# Patient Record
Sex: Male | Born: 1950 | Race: White | Hispanic: No | Marital: Married
Health system: Southern US, Community
[De-identification: ages and names within clinical notes are randomized; demographics above are authoritative.]

---

## 2004-01-15 ENCOUNTER — Observation Stay: Payer: Self-pay | Admitting: Internal Medicine

## 2004-02-15 ENCOUNTER — Ambulatory Visit: Payer: Self-pay | Admitting: Family Medicine

## 2004-11-01 ENCOUNTER — Ambulatory Visit: Payer: Self-pay | Admitting: Family Medicine

## 2005-04-14 ENCOUNTER — Ambulatory Visit: Payer: Self-pay

## 2009-11-15 ENCOUNTER — Ambulatory Visit: Payer: Self-pay

## 2011-06-04 ENCOUNTER — Ambulatory Visit: Payer: Self-pay | Admitting: Internal Medicine

## 2012-02-25 ENCOUNTER — Ambulatory Visit: Payer: Self-pay | Admitting: Internal Medicine

## 2012-06-28 ENCOUNTER — Ambulatory Visit: Payer: Self-pay | Admitting: General Practice

## 2012-06-28 LAB — BASIC METABOLIC PANEL
Calcium, Total: 9.6 mg/dL (ref 8.5–10.1)
Creatinine: 0.97 mg/dL (ref 0.60–1.30)
EGFR (African American): >60
Glucose: 88 mg/dL (ref 65–99)
Osmolality: 275 (ref 275–301)
Potassium: 3.5 mmol/L (ref 3.5–5.1)
Sodium: 139 mmol/L (ref 136–145)

## 2012-06-28 LAB — URINALYSIS, COMPLETE
Bilirubin,UR: NEGATIVE
Leukocyte Esterase: NEGATIVE
Protein: NEGATIVE
Specific Gravity: 1.016 (ref 1.003–1.030)

## 2012-06-28 LAB — CBC
MCH: 29.9 pg (ref 26.0–34.0)
MCHC: 34.3 g/dL (ref 32.0–36.0)
RBC: 5.78 10*6/uL (ref 4.40–5.90)
WBC: 5 10*3/uL (ref 3.8–10.6)

## 2012-06-28 LAB — MRSA PCR SCREENING

## 2012-06-28 LAB — PROTIME-INR
INR: 1
Prothrombin Time: 13.3 secs (ref 11.5–14.7)

## 2012-06-29 LAB — URINE CULTURE

## 2012-07-12 ENCOUNTER — Inpatient Hospital Stay: Payer: Self-pay | Admitting: General Practice

## 2012-07-13 LAB — BASIC METABOLIC PANEL
Calcium, Total: 8.2 mg/dL — ABNORMAL LOW (ref 8.5–10.1)
Chloride: 104 mmol/L (ref 98–107)
Co2: 30 mmol/L (ref 21–32)
Creatinine: 1.34 mg/dL — ABNORMAL HIGH (ref 0.60–1.30)
Potassium: 3.7 mmol/L (ref 3.5–5.1)
Sodium: 139 mmol/L (ref 136–145)

## 2012-07-13 LAB — PLATELET COUNT: Platelet: 68 10*3/uL — ABNORMAL LOW (ref 150–440)

## 2012-07-13 LAB — HEMOGLOBIN: HGB: 13.5 g/dL (ref 13.0–18.0)

## 2012-07-14 LAB — HEMOGLOBIN: HGB: 13.9 g/dL (ref 13.0–18.0)

## 2012-07-14 LAB — BASIC METABOLIC PANEL
BUN: 9 mg/dL (ref 7–18)
Calcium, Total: 8.3 mg/dL — ABNORMAL LOW (ref 8.5–10.1)
Co2: 27 mmol/L (ref 21–32)
EGFR (African American): >60
Potassium: 3.6 mmol/L (ref 3.5–5.1)
Sodium: 138 mmol/L (ref 136–145)

## 2012-07-20 ENCOUNTER — Emergency Department: Payer: Self-pay | Admitting: Emergency Medicine

## 2012-07-20 LAB — COMPREHENSIVE METABOLIC PANEL
Albumin: 3.9 g/dL (ref 3.4–5.0)
Alkaline Phosphatase: 187 U/L — ABNORMAL HIGH (ref 50–136)
Anion Gap: 7 (ref 7–16)
Calcium, Total: 9.8 mg/dL (ref 8.5–10.1)
Chloride: 99 mmol/L (ref 98–107)
Co2: 27 mmol/L (ref 21–32)
Creatinine: 1.44 mg/dL — ABNORMAL HIGH (ref 0.60–1.30)
EGFR (African American): 59 — ABNORMAL LOW
Glucose: 104 mg/dL — ABNORMAL HIGH (ref 65–99)
Osmolality: 268 (ref 275–301)
SGOT(AST): 75 U/L — ABNORMAL HIGH (ref 15–37)

## 2012-07-20 LAB — DRUG SCREEN, URINE
Barbiturates, Ur Screen: NEGATIVE (ref ?–200)
MDMA (Ecstasy)Ur Screen: NEGATIVE (ref ?–500)
Methadone, Ur Screen: NEGATIVE (ref ?–300)
Tricyclic, Ur Screen: NEGATIVE (ref ?–1000)

## 2012-07-20 LAB — URINALYSIS, COMPLETE
Glucose,UR: NEGATIVE mg/dL (ref 0–75)
Nitrite: NEGATIVE
Ph: 6 (ref 4.5–8.0)
Protein: 30
Specific Gravity: 1.023 (ref 1.003–1.030)
Squamous Epithelial: <1
WBC UR: 2 /HPF (ref 0–5)

## 2012-07-20 LAB — CBC
HCT: 44 % (ref 40.0–52.0)
HGB: 15 g/dL (ref 13.0–18.0)
MCHC: 33.9 g/dL (ref 32.0–36.0)
MCV: 88 fL (ref 80–100)
RDW: 14.4 % (ref 11.5–14.5)

## 2012-07-20 LAB — TROPONIN I: Troponin-I: <0.02 ng/mL

## 2012-07-20 LAB — SALICYLATE LEVEL: Salicylates, Serum: <1.7 mg/dL

## 2012-07-20 LAB — ACETAMINOPHEN LEVEL: Acetaminophen: <2 ug/mL

## 2013-06-24 ENCOUNTER — Ambulatory Visit: Payer: Self-pay | Admitting: Internal Medicine

## 2013-07-13 ENCOUNTER — Ambulatory Visit: Payer: Self-pay | Admitting: Gastroenterology

## 2013-07-14 ENCOUNTER — Ambulatory Visit: Payer: Self-pay | Admitting: Gastroenterology

## 2013-08-05 ENCOUNTER — Ambulatory Visit: Payer: Self-pay | Admitting: Physical Medicine and Rehabilitation

## 2013-11-06 DEATH — deceased

## 2014-04-28 NOTE — Op Note (Signed)
PATIENT NAME:  Duane Daniels, Duane Daniels MR#:  [id number] DATE OF BIRTH:  January 10, 1950  DATE OF PROCEDURE:  07/12/2012  PREOPERATIVE DIAGNOSIS: Degenerative arthrosis of the left knee.   POSTOPERATIVE DIAGNOSIS: Degenerative arthrosis of the left knee.   PROCEDURE PERFORMED: Left total knee arthroplasty using computer-assisted navigation.   SURGEON: Illene Labrador. Hooten, M.D.   ASSISTANT: Van Clines, PA (required to maintain retraction throughout the procedure)   ANESTHESIA: General.   ESTIMATED BLOOD LOSS: 100 mL.   FLUIDS REPLACED: 2100 mL of crystalloid.   TOURNIQUET TIME: 99 minutes.   DRAINS: Two medium drains to reinfusion system.   SOFT TISSUE RELEASES: Anterior cruciate ligament, posterior cruciate ligament, deep and superficial medial collateral ligament and patellofemoral ligament.   IMPLANTS UTILIZED: DePuy PFC Sigma size 4 posterior stabilized femoral component (cemented), size 4 MBT tibial component (cemented), 38 mm 3-peg oval dome patella (cemented) and a 12.5 mm stabilized rotating platform polyethylene insert.   INDICATIONS FOR SURGERY: The patient is a 64 year old gentleman, who has been seen for complaints of progressive left knee pain. X-rays demonstrated severe degenerative changes in tricompartmental fashion. After discussion of the risks and benefits of surgical intervention, the patient expressed understanding of the risks and benefits and agreed with plans for surgical intervention.   PROCEDURE IN DETAIL: The patient was brought to the operating room and, after adequate general anesthesia was achieved, a tourniquet was placed on the patient's upper left thigh. The patient's left knee and leg were cleaned and prepped with alcohol and DuraPrep and draped in the usual sterile fashion. A "timeout" was performed as per usual protocol. The left lower extremity was exsanguinated using an Esmarch, and tourniquet was inflated to 300 mmHg. An anterior longitudinal incision was made followed  by a standard mid vastus approach. A large effusion was evacuated. The deep fibers of the medial collateral ligament were elevated in a subperiosteal fashion off the medial flare of the tibia so as to maintain a continuous soft tissue sleeve. The patella was subluxed laterally and the patellofemoral ligament was incised. Inspection of the knee demonstrated severe degenerative changes in a tricompartmental fashion. Prominent osteophytes were debrided using a rongeur. The anterior and posterior cruciate ligaments were excised. Two 4.0 mm Schanz pins were inserted into the femur and into the tibia for attachment of the ray of trackers used for computer-assisted navigation. Hip center was identified using a circumduction technique. Distal landmarks were mapped using the computer. The distal femur and proximal tibia were mapped using the computer. Distal femoral cutting guide was positioned using computer-assisted navigation so as to achieve a 5 degree distal valgus cut. Cut was performed and verified using the computer. The distal femur was sized and it was felt that a size 4 femoral component was appropriate. A size 4 cutting guide was positioned and the anterior cut was performed and verified using the computer. This was followed by completion of the posterior and chamfer cuts. Femoral cutting guide for the central box was then positioned and the central box cut was performed.   Attention was then directed to the proximal tibia. Medial and lateral menisci were excised. The extramedullary tibial cutting guide was positioned using computer-assisted navigation so as to achieve a 0 degree varus valgus alignment and 0 degree posterior slope. Cut was performed and verified using the computer. The proximal tibia was sized and it was felt that a size 4 tibial tray was appropriate. Tibial and femoral trials were inserted followed by insertion of a 10 mm polyethylene insert.  The knee was felt to be tight medially. A Cobb  elevator was used to elevate the superficial fibers of the medial collateral ligament. The 10 mm polyethylene trial was replaced by a 12.5 mm polyethylene trial. This allowed for excellent mediolateral soft tissue balancing both in flexion and in extension. Finally, the patella was cut and prepared so as to accommodate a 38 mm 3-peg oval dome patella. Patellar trial was placed and the knee was placed through a range of motion. Excellent patellar tracking was appreciated. The femoral trial was removed. Central post hole for the tibial component was reamed followed by insertion of a keel punch. Tibial trial was then removed. The cut surfaces of bone were irrigated with copious amounts of normal saline with antibiotic solution and then suctioned dry. Polymethyl methacrylate cement with gentamicin was prepared in the usual fashion using a vacuum mixer. Cement was applied to the cut surface of the proximal tibia as well as along the undersurface of a size 4 MBT tibial component. The tibial component was positioned and impacted into place. Excess cement was removed using freer elevators. Cement was then applied to the cut surface of the femur as well as along the posterior flanges of a size 4 posterior stabilized femoral component. Femoral component was positioned and impacted into place. Excess cement was removed using freer elevators. A 12.5 mm polyethylene trial was inserted and the knee was brought in full extension with steady axial compression applied. Finally, cement was applied to the backside of a 38 mm 3-peg oval dome patella and the patellar component was positioned and patellar clamp applied. Excess cement was removed using freer elevators. After adequate curing of cement, the tourniquet was deflated after a total tourniquet time of 99 minutes. Hemostasis was achieved using electrocautery. The knee was irrigated with copious amounts of normal saline with antibiotic solution using pulsatile lavage and then  suctioned dry. The knee was inspected for any residual cement debris. A total of  60 mL of 1.3% Exparel in injectable normal saline was injected along the medial and lateral gutters as well as along the posterior recess. The arthrotomy site and periosteum was also injected. A 12.5 mm stabilized rotating platform polyethylene insert was inserted and the knee was placed through a range of motion. Excellent mediolateral soft tissue balancing was appreciated both in flexion and in extension. Excellent patellar tracking was appreciated. Two medium drains were placed in the wound bed and brought out through a separate stab incision and reattached to a reinfusion system. The medial parapatellar portion of the incision was reapproximated using interrupted sutures of #1 Vicryl. The subcutaneous tissue was injected with a total of 30 mL of 0.25% Marcaine with epinephrine. The subcutaneous tissue was then reapproximated in layers using first #0 Vicryl followed by 2-0 Vicryl. Skin was closed with skin staples. A sterile dressing was applied. The patient tolerated the procedure well. He was transported to the recovery room in stable condition.   ____________________________ Illene LabradorJames P. Angie FavaHooten Jr., MD jph:aw D: 07/13/2012 00:41:27 ET T: 07/13/2012 07:46:31 ET JOB#: [id number]  cc: Fayrene FearingJames P. Angie FavaHooten Jr., MD, <Dictator> JAMES P Angie FavaHOOTEN JR MD ELECTRONICALLY SIGNED 07/17/2012 12:13

## 2014-04-28 NOTE — Discharge Summary (Signed)
PATIENT NAME:  Duane Daniels, SCHWANDT MR#:  [id number] DATE OF BIRTH:  [birth year]  DATE OF ADMISSION:  07/12/2012 DATE OF DISCHARGE:  07/15/2012   DICTATING FOR: Fayrene Fearing P. Angie Fava., MD  ADMITTING DIAGNOSIS: Degenerative arthrosis of the left knee.   DISCHARGE DIAGNOSIS: Degenerative arthrosis of the left knee.  HISTORY: The patient is a 64 year old who has been followed at [facility] for progression of left knee pain. The patient had reported a remote injury to the left knee while in high school. He had previously undergone a left knee arthroscopy. The patient had reported progressive increase in the knee pain over the last several years. He reports activity-related swelling as well as start-up stiffness. He had localized most of the pain along the medial aspect of the knee. His pain was noted to be aggravated with weight-bearing activities. He had reported only modest improvement in his symptoms with the use of anti-inflammatory medications. At the time of surgery, he was using a cane for ambulation due to the severity of discomfort. The patient states his pain had progressed to the point that it was significantly interfering with his activities of daily living.   The patient has a past history of hepatitis and cirrhosis of the liver. He has been followed for this. He sees Dr.  Graciela Husbands on a regular basis. He has a chronic low platelet count of around 67. Dr. Graciela Husbands felt that this was due to his hepatitis and would probably always stay at this level. He felt that surgery was okay to proceed with. X-rays taken in [facility] showed narrowing of the medial compartment space with associated varus alignment. Subchondral sclerosis was noted. After discussion of the risks and benefits of surgical intervention, the patient expressed his understanding of the risks and benefits and agreed for plans for surgical intervention.   PROCEDURE: Left total knee arthroplasty using computer-assisted  navigation.   ANESTHESIA: General.   SOFT TISSUE RELEASE: Anterior cruciate ligament, posterior cruciate ligament, deep and superficial medial collateral ligaments as well as the patellofemoral ligament.   IMPLANTS UTILIZED: DePuy PFC Sigma size 4 posterior stabilized femoral component (cemented), size 4 MBT tibial component (cemented), 38 mm 3-pegged oval dome patella (cemented), and a 12.5 mm stabilized rotating platform polyethylene insert.   HOSPITAL COURSE: The patient tolerated the procedure very well. Postoperatively, he had no complications. He was then taken to the PACU, where he was stabilized and then transferred to the orthopedic floor. The patient began receiving anticoagulation therapy of Xarelto 10 mg b.i.d. This was started secondary to the fact of his chronic low platelet count. The patient was also fitted with the AVI compression foot pumps bilaterally set at 80 mmHg bilaterally. He was fitted with TED stockings bilaterally. These were allowed to be removed 1 hour per 8-hour shift. The left one was applied on day 2 following removal of the Hemovac and dressing change. The patient's heels were elevated off the bed using rolled towels. There has been no evidence of any DVTs. Calves were nontender. Negative Homans sign.   The patient has denied any chest pains or shortness of breath. Vital signs have been stable. He has been afebrile. Hemodynamically, no transfusions were given other than the Autovac transfusion given the first 6 hours postoperatively. Laboratory studies showed his platelet count did drop to 53, but was staying stable. As stated earlier, this is a chronic condition. This is pretty much where he stays according to Dr. Graciela Husbands.   The patient began receiving physical  therapy on day 1 for gait training and transfers. He has done very well. Upon being discharged, he was ambulating greater than 200 feet. He was able go up and down 4 sets of steps. He was independent with  bed-to-chair transfers. Occupational therapy was also initiated on day 1 for ADLs and assistive devices. There have been no complications. He has been progressing extremely well.   The patient's IV, Foley and Hemovac were discontinued on day 2 along with the dressing change. The wound was free of any drainage or signs of infection. With removal of the Hemovac, there was no oozing noted. The Polar Care was reapplied to the patient's surgical leg, maintaining a temperature of 40 to 50 degrees Fahrenheit.   DISPOSITION: The patient is being discharged to home in improved stable condition.   DISCHARGE INSTRUCTIONS:  1. He may weight bear as tolerated. Continue using a walker until cleared by physical therapy to go to a quad cane.  2. He will receive home health PT.  3. TED stockings are to be worn during the day, but may be removed at night.  4. Polar Care to the surgical leg, maintaining a temperature of 40 to 50 degrees Fahrenheit.  5. He is placed on a regular diet.  6. He was instructed on keeping the leg elevated when sitting in a chair.  7. [facility] at 8:45. He is to call the clinic sooner if any temperatures of 101.5 or greater.  8. They will change the dressing as needed.   PAST MEDICAL HISTORY:  1. Myocardial infarction.  2. Coronary artery disease.  3. Hepatitis C.  4. Cirrhosis of the liver.  5. Depression/anxiety.  6. Hypertension.  7. Mini-stroke.  8. Migraines.  9. COPD/asthma.  10. Erectile dysfunction.   ____________________________ Van ClinesJon Wolfe, PA jrw:OSi D: 07/15/2012 07:14:41 ET T: 07/15/2012 07:41:16 ET JOB#: [id number]  cc: Van ClinesJon Wolfe, PA, <Dictator> JON WOLFE PA ELECTRONICALLY SIGNED 07/18/2012 21:30

## 2014-10-17 IMAGING — US ABDOMEN ULTRASOUND LIMITED
1 series · 14 of 25 positions shown · non-contrast
Comparison: 02/25/2012

CLINICAL DATA: Cirrhosis, hypertension, hepatitis-C

EXAM:
US ABDOMEN LIMITED - RIGHT UPPER QUADRANT

[Series 1: abdomen ultrasound limited · 0.32mm/px · 14 of 48 slices shown]
[im 1/48]
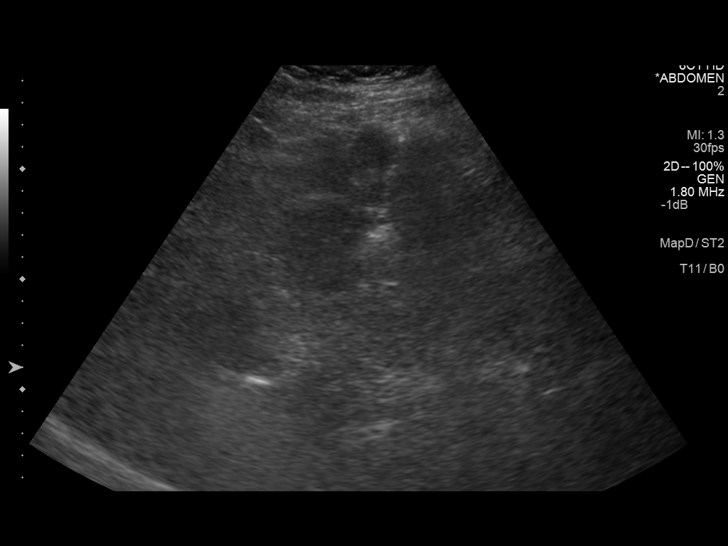
[im 4/48]
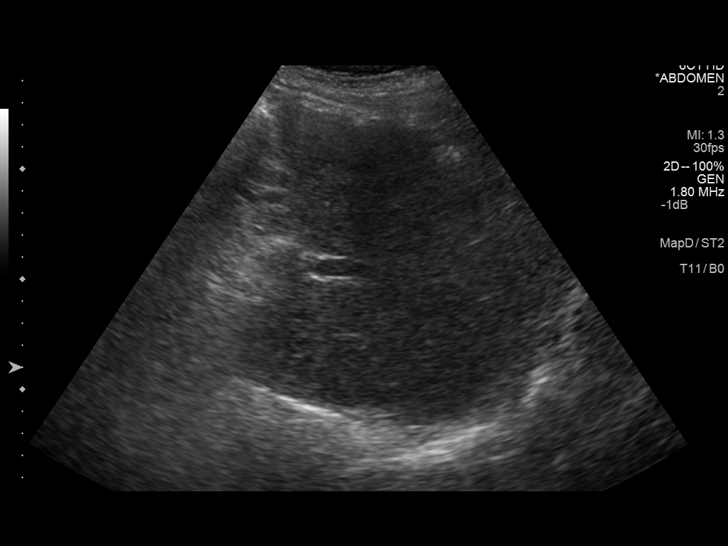
[im 8/48]
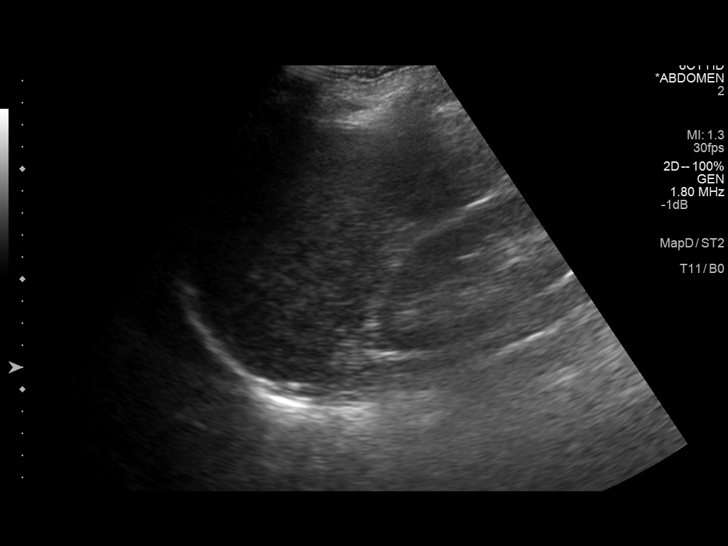
[im 12/48]
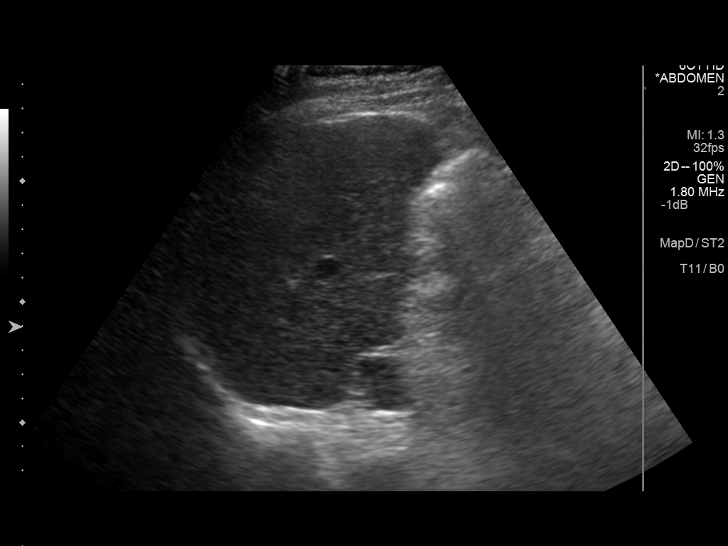
[im 16/48]
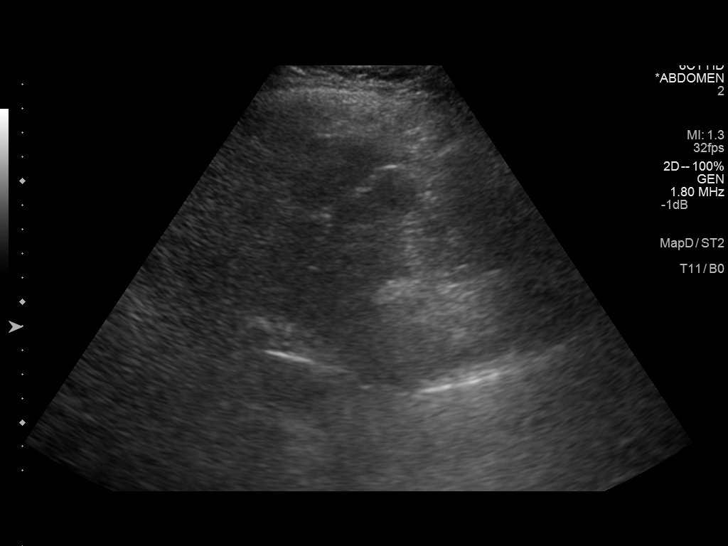
[im 18/48]
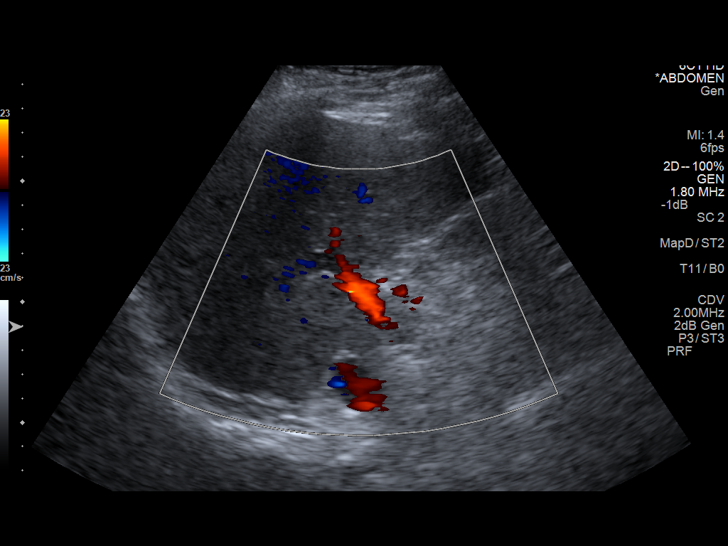
[im 22/48]
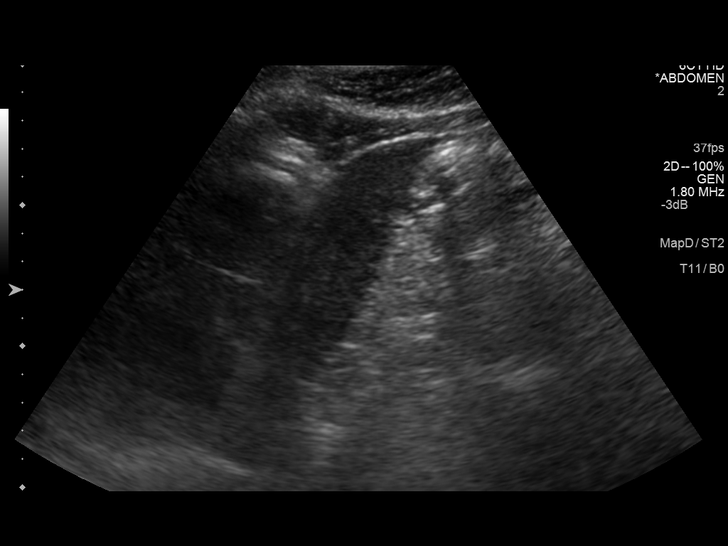
[im 26/48]
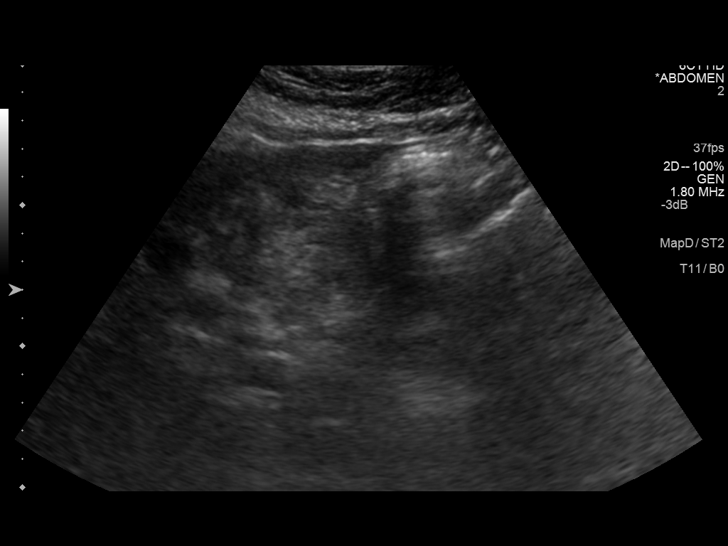
[im 30/48]
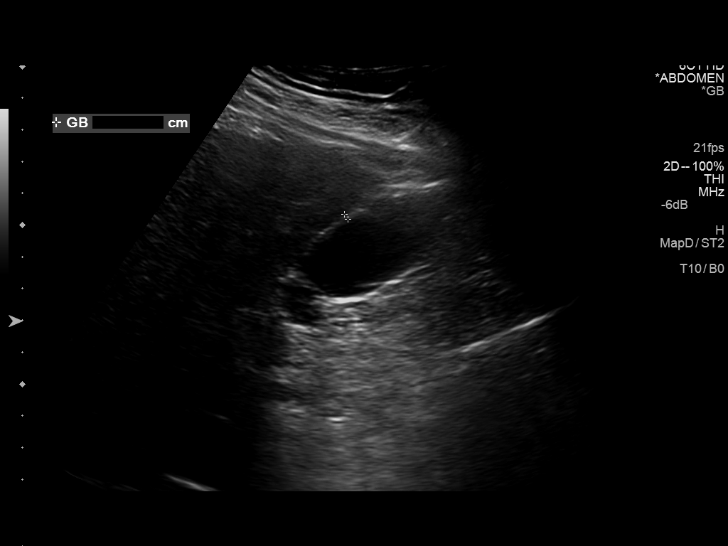
[im 32/48]
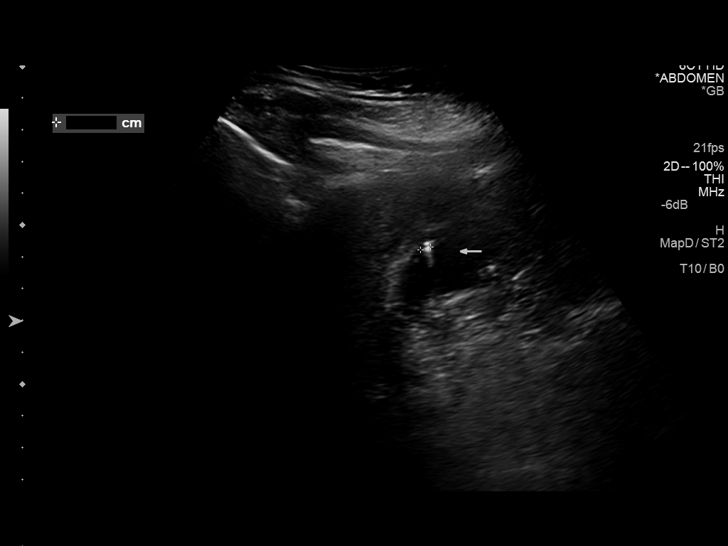
[im 36/48]
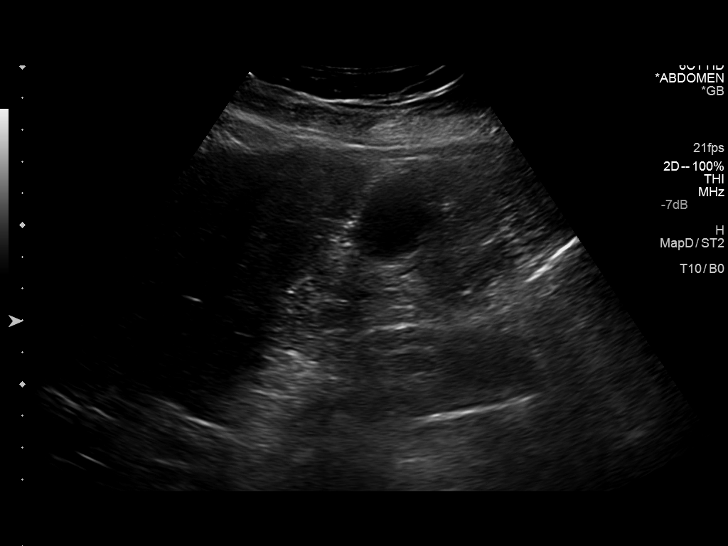
[im 40/48]
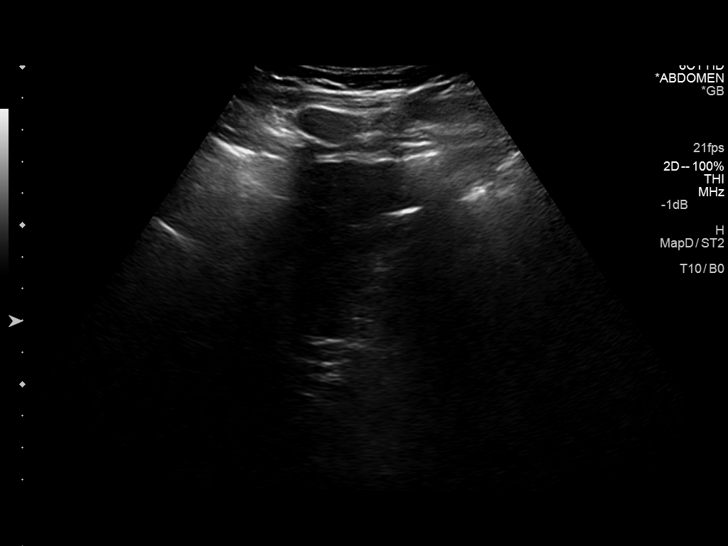
[im 44/48]
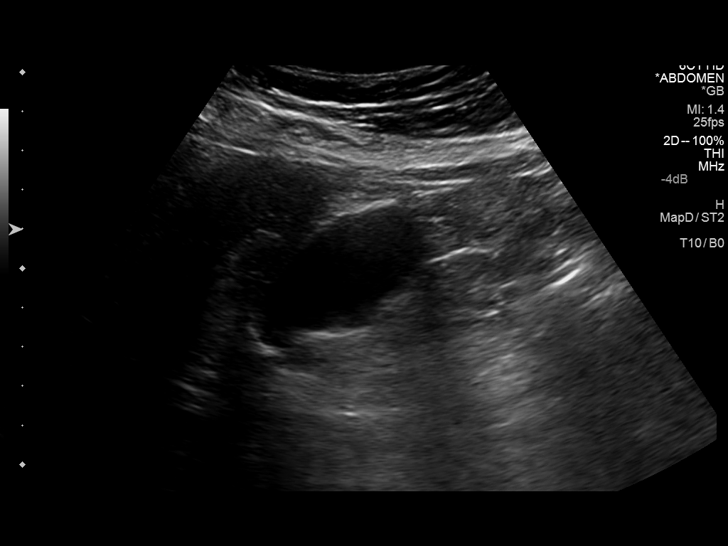
[im 48/48]
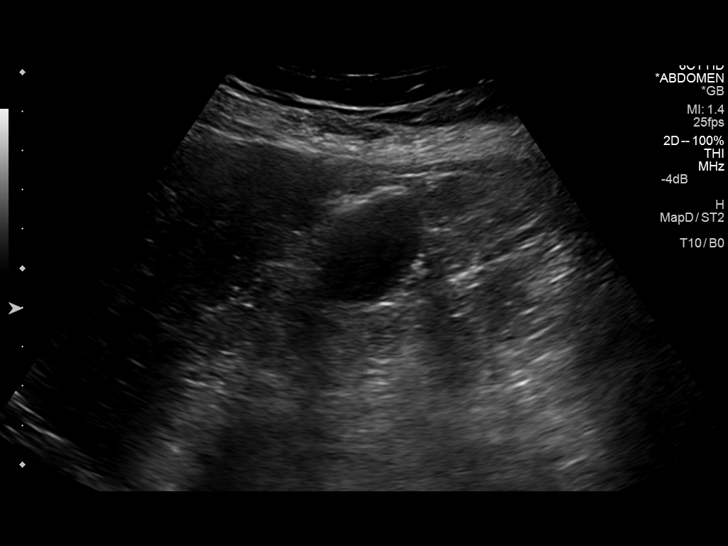

[14 of 25 positions shown; findings below may reference images not displayed]

FINDINGS: Gallbladder:

No gallstones are noted within gallbladder. Small cholesterol polyp
is noted gallbladder wall measures 3 mm. No thickening of
gallbladder wall. No sonographic Murphy's sign.

Common bile duct:

Diameter: 4.8 mm in diameter within normal limits.

Liver:

No focal lesion identified. Mild coarse echotexture without
intrahepatic biliary ductal dilatation.
IMPRESSION: 1. No shadowing gallstones are noted within gallbladder. Probable
small cholesterol polyp gallbladder wall measures 3 mm. Normal CBD.
No focal hepatic mass.

## 2014-11-05 IMAGING — CT CT ABDOMEN WO/W CM
2 of 9 series · 13 of 46 positions shown, 15 images · IV contrast (isovue)
Comparison: None.

CLINICAL DATA: Right upper quadrant intermittent cramping. Elevated
liver function tests.

EXAM:
CT ABDOMEN WITHOUT AND WITH CONTRAST
TECHNIQUE: Multidetector CT imaging of the abdomen was performed following the
standard protocol before and following the bolus administration of
intravenous contrast.
CONTRAST:  125 cc of Isovue 370

[Series 6: liver venous · axial · portal-venous · 0.76mm/px · z∈[-885,-687]mm · 10 of 123 slices shown, 12 images]
[im 12/123  soft-tissue]
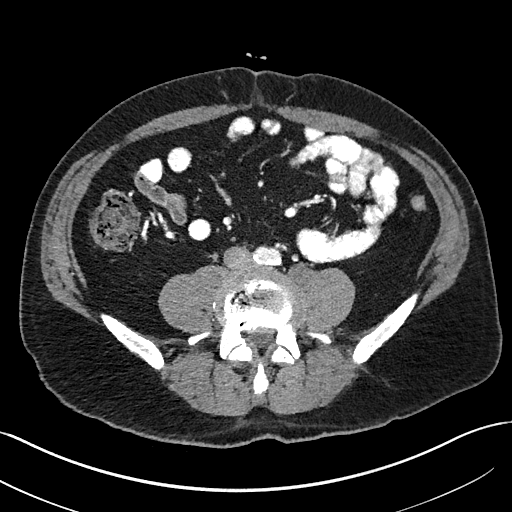
[im 12/123  bone]
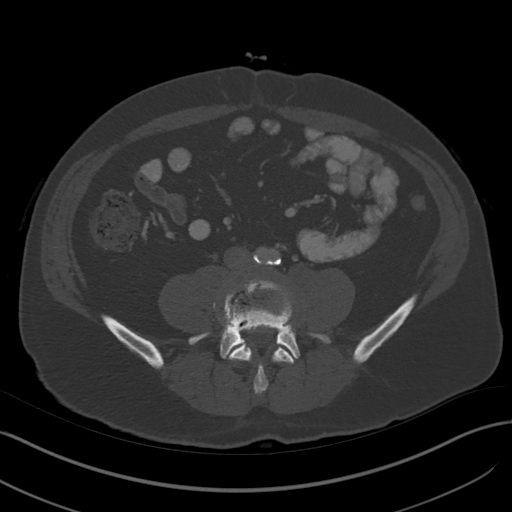
[im 23/123  soft-tissue]
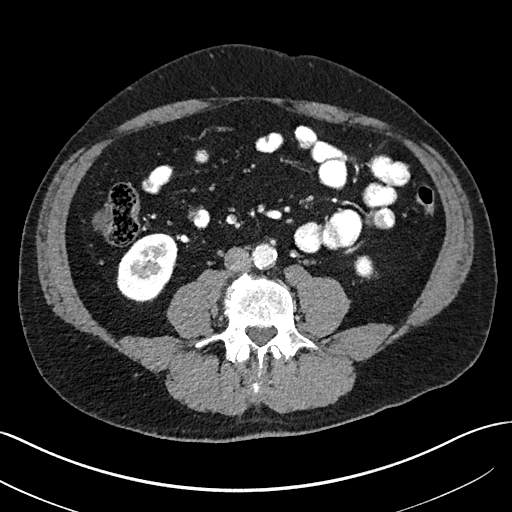
[im 34/123  soft-tissue]
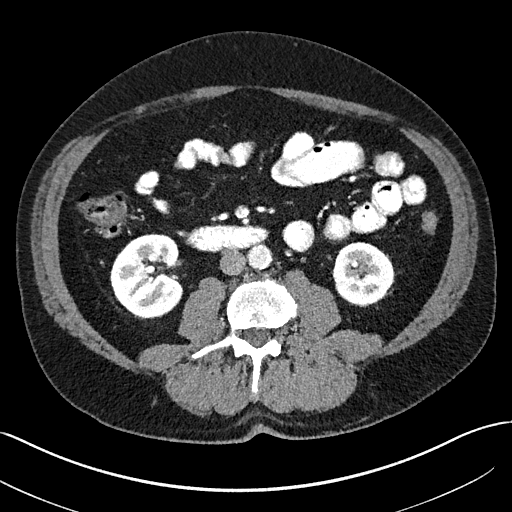
[im 45/123  soft-tissue]
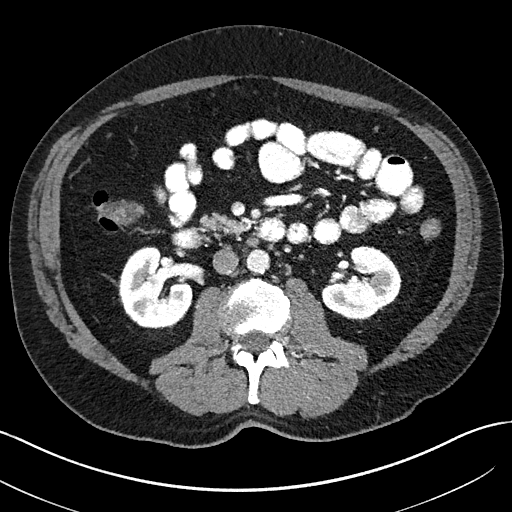
[im 56/123  soft-tissue]
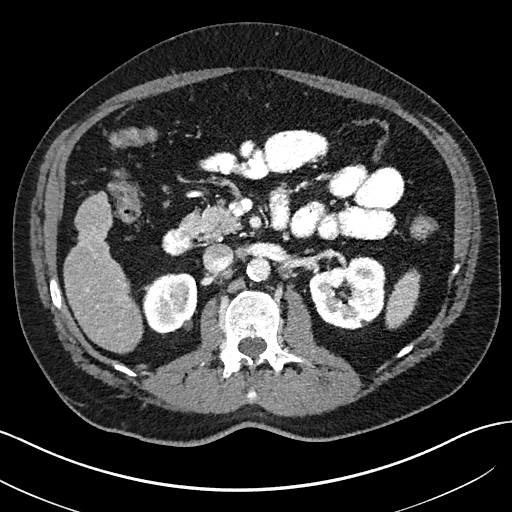
[im 67/123  soft-tissue]
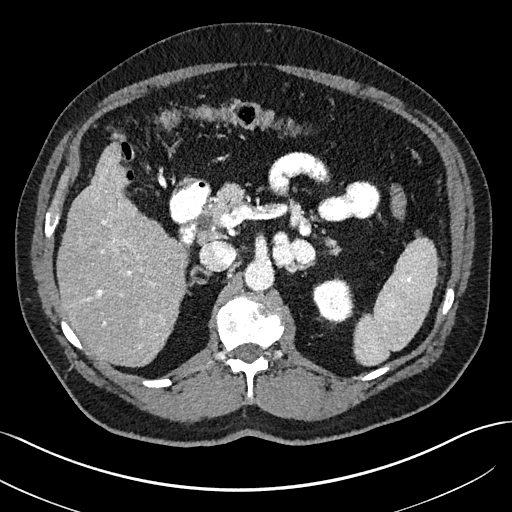
[im 78/123  soft-tissue]
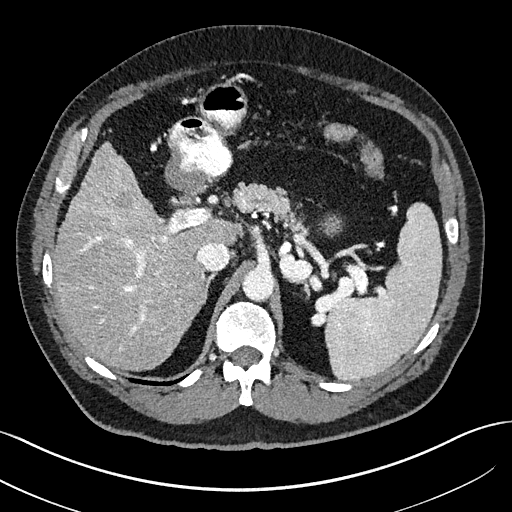
[im 89/123  soft-tissue]
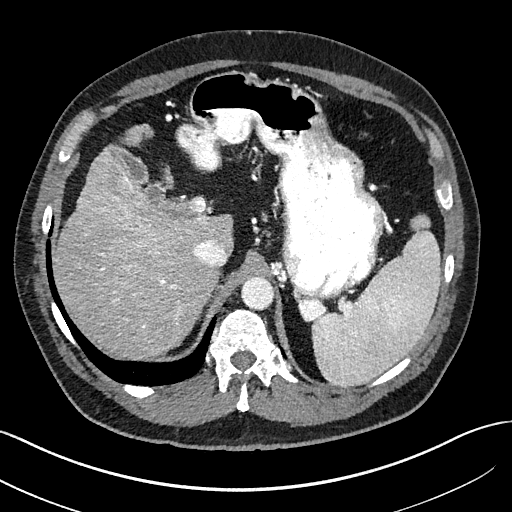
[im 100/123  soft-tissue]
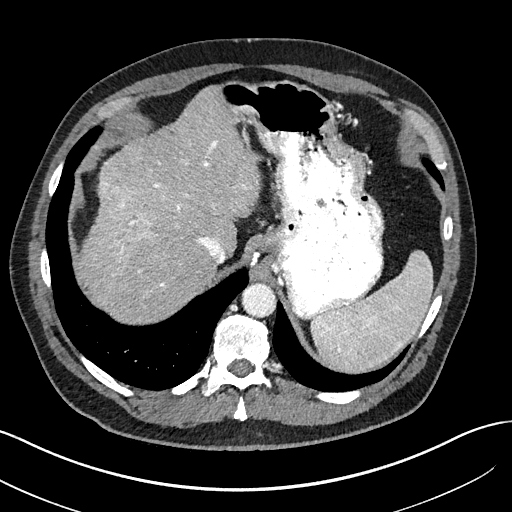
[im 100/123  bone]
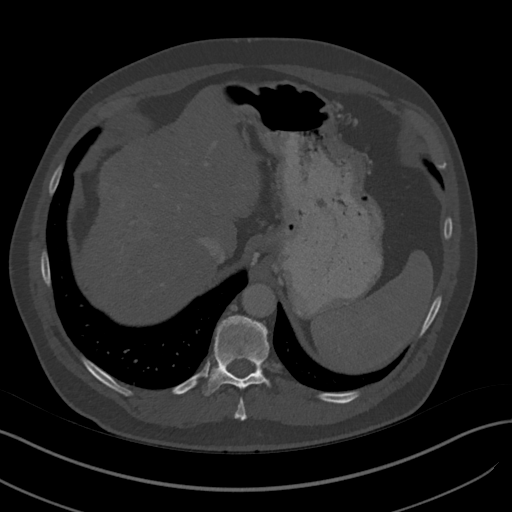
[im 111/123  soft-tissue]
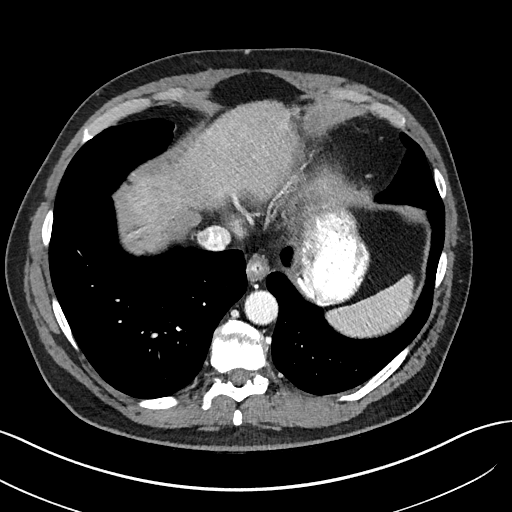

[Series 9: liver arterial cor · coronal · arterial · 0.47mm/px · 3 of 171 slices shown]
[im 43/171  soft-tissue]
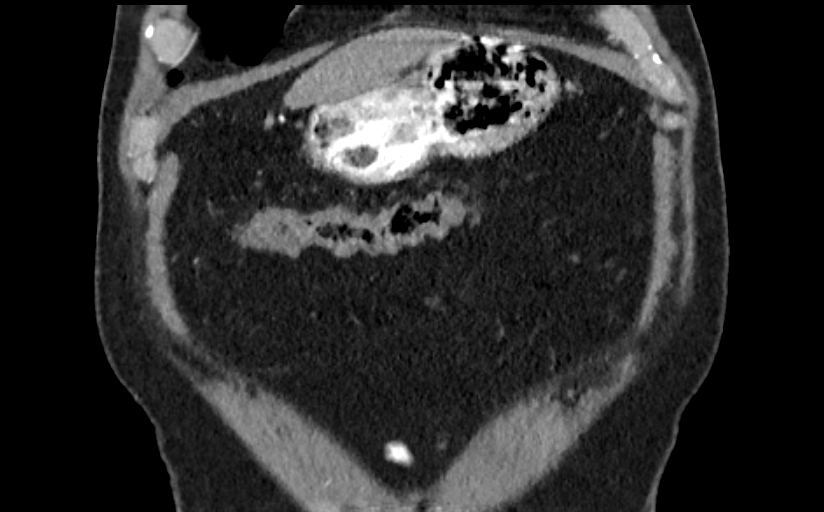
[im 86/171  soft-tissue]
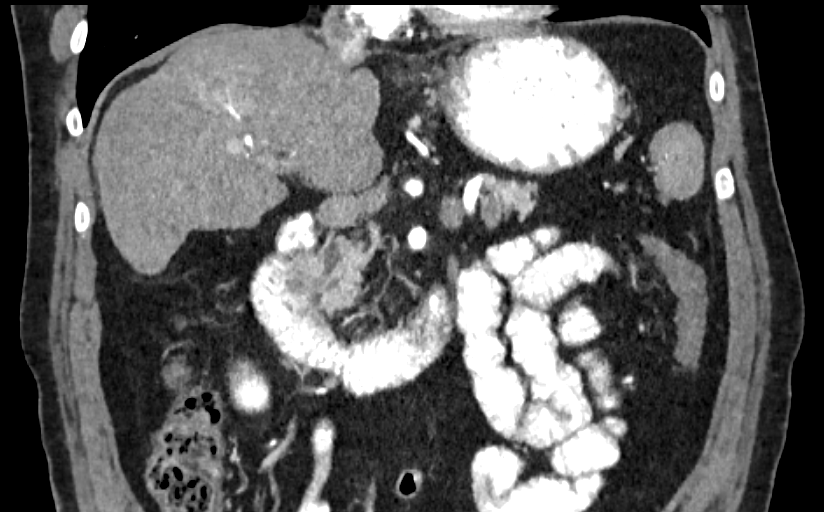
[im 128/171  soft-tissue]
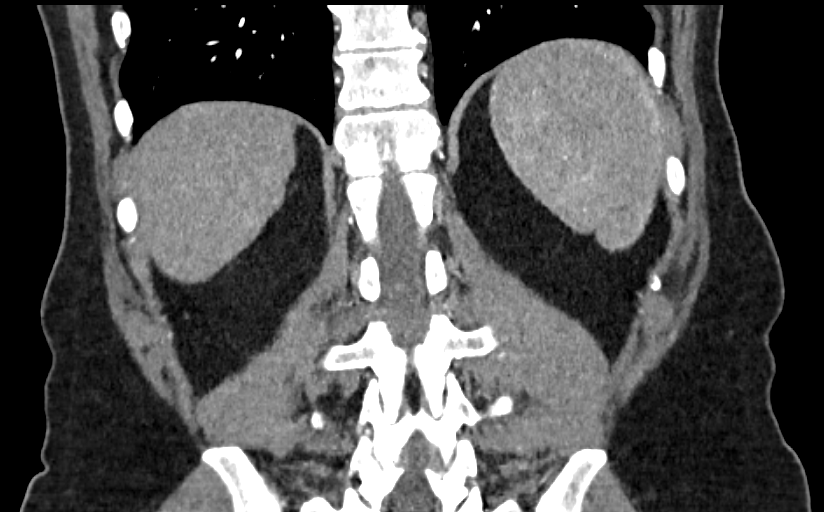

[13 of 46 positions shown; findings below may reference images not displayed]

FINDINGS: No pleural effusion identified. There is no pericardial effusion.
Calcified granulomas are identified within the lung bases. No
suspicious pulmonary nodule or mass.

Morphologic features of the liver are identified compatible with
cirrhosis. The contour the liver is nodular and the parenchyma
appears heterogeneous. There is a hyperenhancing mass within the
anterior right hepatic lobe which measures 5 x 6.1 cm, image
24/series 4. There is a direct extension of this mass into the
middle branch of the portal vein and into the main portal vein .
Additional foci of hyper enhancement are noted within the right
hepatic lobe. Adjacent to the larger mass is a 1.3 cm lesion, image
22/series 4. Index lesion posteriorly measures 1.5 cm, image
25/series 4. No lesions identified within the left hepatic lobe.

Gallbladder is on unremarkable. There is no biliary dilatation.
Normal appearance of the pancreas. The spleen measures 12.3 cm in
length. Gastric and splenic varices are identified. Splenorenal
shunt is identified.

The adrenal glands are both normal. There is a cyst within the upper
pole of the right kidney. Smaller cyst is in the upper pole the left
kidney.

Calcified atherosclerotic disease involves the abdominal aorta. No
aneurysm. Enlarged upper abdominal lymph nodes are identified. Index
aortocaval node measures 1.4 cm, image 72/series 6.

No ascites.  The bowel loops of the upper abdomen are unremarkable.

Review of the visualized osseous structures is significant for
degenerative disc disease within the L4-5 level.
IMPRESSION: 1. Cirrhosis of the liver with mullticentric hepatoma involving the
right hepatic lobe.
2. Tumor thrombus extends into the middle portal vein.
3. Metastatic adenopathy within the upper abdomen.
4. Stigmata of portal venous hypertension
